# Patient Record
Sex: Female | Born: 1990 | Race: White | Hispanic: No | Marital: Married | Smoking: Never smoker
Health system: Southern US, Community
[De-identification: ages and names within clinical notes are randomized; demographics above are authoritative.]

## PROBLEM LIST (undated history)

## (undated) DIAGNOSIS — F32A Depression, unspecified: Secondary | ICD-10-CM

## (undated) DIAGNOSIS — F419 Anxiety disorder, unspecified: Secondary | ICD-10-CM

## (undated) DIAGNOSIS — F329 Major depressive disorder, single episode, unspecified: Secondary | ICD-10-CM

## (undated) DIAGNOSIS — F909 Attention-deficit hyperactivity disorder, unspecified type: Secondary | ICD-10-CM

## (undated) HISTORY — DX: Major depressive disorder, single episode, unspecified: F32.9

## (undated) HISTORY — PX: WISDOM TOOTH EXTRACTION: SHX21

## (undated) HISTORY — PX: BREAST ENHANCEMENT SURGERY: SHX7

## (undated) HISTORY — DX: Depression, unspecified: F32.A

## (undated) HISTORY — DX: Anxiety disorder, unspecified: F41.9

## (undated) HISTORY — DX: Attention-deficit hyperactivity disorder, unspecified type: F90.9

---
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
## 2012-09-22 ENCOUNTER — Observation Stay: Payer: Self-pay | Admitting: Obstetrics and Gynecology

## 2012-09-24 ENCOUNTER — Observation Stay: Payer: Self-pay

## 2012-10-07 ENCOUNTER — Observation Stay: Payer: Self-pay | Admitting: Obstetrics and Gynecology

## 2012-10-07 LAB — URINALYSIS, COMPLETE
Leukocyte Esterase: NEGATIVE
Ph: 6 (ref 4.5–8.0)
Protein: NEGATIVE
RBC,UR: NONE SEEN /HPF (ref 0–5)
Specific Gravity: 1.014 (ref 1.003–1.030)
Squamous Epithelial: 3

## 2012-10-09 ENCOUNTER — Inpatient Hospital Stay: Payer: Self-pay | Admitting: Obstetrics and Gynecology

## 2012-10-09 LAB — CBC WITH DIFFERENTIAL/PLATELET
Eosinophil %: 0.1 %
HCT: 34.3 % — ABNORMAL LOW (ref 35.0–47.0)
Lymphocyte #: 1.8 10*3/uL (ref 1.0–3.6)
Lymphocyte %: 17.7 %
MCV: 85 fL (ref 80–100)
Monocyte #: 0.7 x10 3/mm (ref 0.2–0.9)
Monocyte %: 7 %
Neutrophil %: 74.8 %
Platelet: 238 10*3/uL (ref 150–440)
RBC: 4.01 10*6/uL (ref 3.80–5.20)

## 2012-10-10 LAB — HEMATOCRIT: HCT: 30.6 % — ABNORMAL LOW (ref 35.0–47.0)

## 2015-01-16 NOTE — H&P (Signed)
L&D Evaluation:  History:   HPI 24 y/o G1 @ 38/3wks EDC 10/19/12 arrived through the night with c/o painful contractions. Denies leaking fuid or vaginal bleeding, baby is active. Care @ [facility] well pregnancy, GBS negative.    Presents with contractions    Patient's Medical History No Chronic Illness    Patient's Surgical History wisdom teeth    Medications Pre Natal Vitamins    Allergies NKDA    Social History none   ROS:   ROS All systems were reviewed.  HEENT, CNS, GI, GU, Respiratory, CV, Renal and Musculoskeletal systems were found to be normal.   Exam:   Vital Signs stable    Urine Protein not completed    General no apparent distress    Mental Status clear    Chest clear    Heart normal sinus rhythm    Abdomen gravid, non-tender    Estimated Fetal Weight Average for gestational age    Fetal Position vtx    Fundal Height term    Back no CVAT    Edema 1+  pedal    Reflexes 2+    Clonus negative    Pelvic no external lesions, 1-2cms upon arrival progressed to 4-5cm BBOW vtx @ -1 per RN exam  nl show    Mebranes Intact    FHT normal rate with no decels, aseline 130's avg variabilily with accels    Fetal Heart Rate 136    Ucx regular, Q 3 mins 60 sec mod/str EFM readjusted    Skin dry    Lymph no lymphadenopathy   Impression:   Impression active labor   Plan:   Plan EFM/NST, monitor contractions and for cervical change    Comments Labor progress through the night, admitted. Explained what to expect with first baby. Requests epidural, stadol given while awaiting. Husband to arrive shortly.   Electronic Signatures: Albertina ParrLugiano, Taiyo Kozma B (CNM)  (Signed 01-Feb-14 09:14)  Authored: L&D Evaluation   Last Updated: 01-Feb-14 09:14 by Albertina ParrLugiano, Annell Canty B (CNM)

## 2015-07-11 ENCOUNTER — Ambulatory Visit
Admission: RE | Admit: 2015-07-11 | Discharge: 2015-07-11 | Disposition: A | Discharge status: Home / Self Care | Payer: Managed Care, Other (non HMO) | Source: Ambulatory Visit | Attending: Obstetrics and Gynecology | Admitting: Obstetrics and Gynecology

## 2015-07-11 ENCOUNTER — Other Ambulatory Visit: Payer: Self-pay | Admitting: Obstetrics and Gynecology

## 2015-07-11 DIAGNOSIS — N63 Unspecified lump in unspecified breast: Secondary | ICD-10-CM

## 2015-07-11 DIAGNOSIS — R928 Other abnormal and inconclusive findings on diagnostic imaging of breast: Secondary | ICD-10-CM | POA: Insufficient documentation

## 2015-10-02 DIAGNOSIS — G43009 Migraine without aura, not intractable, without status migrainosus: Secondary | ICD-10-CM | POA: Insufficient documentation

## 2015-11-26 ENCOUNTER — Other Ambulatory Visit: Payer: Self-pay | Admitting: Unknown Physician Specialty

## 2015-11-26 DIAGNOSIS — M5489 Other dorsalgia: Secondary | ICD-10-CM

## 2015-11-26 DIAGNOSIS — G8929 Other chronic pain: Principal | ICD-10-CM

## 2015-11-26 DIAGNOSIS — M549 Dorsalgia, unspecified: Secondary | ICD-10-CM

## 2015-11-26 DIAGNOSIS — M542 Cervicalgia: Secondary | ICD-10-CM

## 2015-12-17 ENCOUNTER — Ambulatory Visit
Admission: RE | Admit: 2015-12-17 | Discharge: 2015-12-17 | Disposition: A | Discharge status: Home / Self Care | Payer: Managed Care, Other (non HMO) | Source: Ambulatory Visit | Attending: Unknown Physician Specialty | Admitting: Unknown Physician Specialty

## 2015-12-17 ENCOUNTER — Ambulatory Visit: Payer: Managed Care, Other (non HMO)

## 2015-12-17 DIAGNOSIS — M50222 Other cervical disc displacement at C5-C6 level: Secondary | ICD-10-CM | POA: Diagnosis not present

## 2015-12-17 DIAGNOSIS — M5489 Other dorsalgia: Secondary | ICD-10-CM

## 2015-12-17 DIAGNOSIS — M549 Dorsalgia, unspecified: Secondary | ICD-10-CM | POA: Diagnosis present

## 2015-12-17 DIAGNOSIS — G8929 Other chronic pain: Secondary | ICD-10-CM | POA: Diagnosis present

## 2015-12-17 DIAGNOSIS — M542 Cervicalgia: Secondary | ICD-10-CM

## 2015-12-17 DIAGNOSIS — M5127 Other intervertebral disc displacement, lumbosacral region: Secondary | ICD-10-CM | POA: Diagnosis not present

## 2016-05-08 ENCOUNTER — Ambulatory Visit: Payer: 59 | Admitting: Psychiatry

## 2016-05-13 ENCOUNTER — Ambulatory Visit: Payer: Self-pay | Admitting: Psychiatry

## 2016-05-20 ENCOUNTER — Ambulatory Visit: Payer: 59 | Admitting: Psychiatry

## 2016-05-20 ENCOUNTER — Encounter: Payer: Self-pay | Admitting: Psychiatry

## 2016-05-20 VITALS — BP 132/78 | HR 103 | Temp 97.5°F | Wt 149.0 lb

## 2016-05-20 DIAGNOSIS — F411 Generalized anxiety disorder: Secondary | ICD-10-CM

## 2016-05-20 MED ORDER — SERTRALINE HCL 50 MG PO TABS
50.0000 mg | ORAL_TABLET | Freq: Every day | ORAL | 1 refills | Status: DC
Start: 2016-05-20 — End: 2016-06-18
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
## 2016-06-11 ENCOUNTER — Ambulatory Visit: Payer: 59 | Admitting: Licensed Clinical Social Worker

## 2016-06-18 ENCOUNTER — Encounter: Payer: Self-pay | Admitting: Psychiatry

## 2016-06-18 ENCOUNTER — Ambulatory Visit (INDEPENDENT_AMBULATORY_CARE_PROVIDER_SITE_OTHER): Payer: 59 | Admitting: Psychiatry

## 2016-06-18 VITALS — BP 128/76 | HR 84 | Temp 98.1°F | Wt 149.4 lb

## 2016-06-18 DIAGNOSIS — F411 Generalized anxiety disorder: Secondary | ICD-10-CM

## 2016-06-18 MED ORDER — SERTRALINE HCL 100 MG PO TABS: 100.0000 mg | ORAL_TABLET | Freq: Every day | ORAL | 1 refills | Status: DC

## 2016-06-18 NOTE — Progress Notes (Signed)
Psychiatric Initial Adult Assessment   Patient Identification: Alexa Jacobs MRN:  [id number] Date of Evaluation:  06/18/2016 Referral Source: Self referred Chief Complaint:   Chief Complaint    Follow-up; Medication Refill     Visit Diagnosis:    ICD-9-CM ICD-10-CM   1. Generalized anxiety disorder 300.02 F41.1     History of Present Illness:   Patient is a 25 year old Caucasian woman presenting for a follow up and treatment of her anxiety. Patient today reports that she is been going through a lot. States that she is been sleeping okay and eats whenever she gets hungry. States that her ex husband has the negotiated for the 7 day on an 7 day off custody between the 2 of them initially and now he is trying to get full custody of her son. States that she is finding it difficult to trust anybody. She denies any suicidal thoughts. She will be starting with a therapist this week.   Past Psychiatric History: Was seeing Dr.Kapur, switching doctors due to change in insurance.  Previous Psychotropic Medications: Yes   Substance Abuse History in the last 12 months:  No.  Consequences of Substance Abuse: Negative  Past Medical History:  Past Medical History:  Diagnosis Date  . ADHD (attention deficit hyperactivity disorder)   . Anxiety   . Depression     Past Surgical History:  Procedure Laterality Date  . WISDOM TOOTH EXTRACTION      Family Psychiatric History:   Family History:  Family History  Problem Relation Age of Onset  . Depression Mother   . Anxiety disorder Mother     Social History:   Social History   Social History  . Marital status: Married    Spouse name: N/A  . Number of children: N/A  . Years of education: N/A   Social History Main Topics  . Smoking status: Never Smoker  . Smokeless tobacco: Never Used  . Alcohol use No  . Drug use: No  . Sexual activity: Not Currently   Other Topics Concern  . None   Social History Narrative  . None     Additional Social History: Lives with her 24 and half year old son  Allergies:  No Known Allergies  Metabolic Disorder Labs: No results found for: HGBA1C, MPG No results found for: PROLACTIN Lab Results  Component Value Date   CHOL 153 10/24/2010   TRIG 79.0 10/24/2010   HDL 53.60 10/24/2010   CHOLHDL 3 10/24/2010   VLDL 15.8 10/24/2010   LDLCALC 84 10/24/2010     Current Medications: Current Outpatient Prescriptions  Medication Sig Dispense Refill  . fluticasone (FLONASE) 50 MCG/ACT nasal spray Place into the nose.    . levonorgestrel (MIRENA) 20 MCG/24HR IUD by Intrauterine route.    . sertraline (ZOLOFT) 50 MG tablet Take 1 tablet (50 mg total) by mouth daily. 30 tablet 1  . nortriptyline (PAMELOR) 10 MG capsule Take by mouth.    . phentermine (ADIPEX-P) 37.5 MG tablet Take by mouth.     No current facility-administered medications for this visit.     Neurologic: Headache: Yes Seizure: No Paresthesias:No  Musculoskeletal: Strength & Muscle Tone: within normal limits Gait & Station: normal Patient leans: N/A  Psychiatric Specialty Exam: ROS  Blood pressure 128/76, pulse 84, temperature 98.1 F (36.7 C), temperature source Oral, weight 149 lb 6.4 oz (67.8 kg), last menstrual period 06/16/2016.Body mass index is 24.86 kg/m.  General Appearance: Fairly Groomed  Eye Contact:  Fair  Speech:  Clear and Coherent  Volume:  Normal  Mood:  Anxious and Dysphoric  Affect:  Congruent  Thought Process:  Coherent  Orientation:  Full (Time, Place, and Person)  Thought Content:  WDL  Suicidal Thoughts:  No  Homicidal Thoughts:  No  Memory:  Immediate;   Fair Recent;   Fair Remote;   Fair  Judgement:  Fair  Insight:  Fair  Psychomotor Activity:  Normal  Concentration:  Concentration: Fair and Attention Span: Fair  Recall:  [employer] of Knowledge:Fair  Language: Fair  Akathisia:  No  Handed:  Right  AIMS (if indicated):  na  Assets:  Communication  Skills Desire for Improvement Housing Physical Health Resilience Social Support  ADL's:  Intact  Cognition: WNL  Sleep:  fair    Treatment Plan Summary:  Anxiety  Increase  Zoloft to 100mg  po qd. Recommend that patient start to see a therapist to deal with her current stressors.  Migraines She has discontinued the Nortriptyline.  Patient has discontinued phentermine  Return to Clinic in 1 month's time or call before if necessary.   Patrick North, MD 10/11/201710:02 AM

## 2016-06-19 ENCOUNTER — Ambulatory Visit (INDEPENDENT_AMBULATORY_CARE_PROVIDER_SITE_OTHER): Payer: 59 | Admitting: Licensed Clinical Social Worker

## 2016-06-19 DIAGNOSIS — F411 Generalized anxiety disorder: Secondary | ICD-10-CM
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
## 2016-07-16 ENCOUNTER — Ambulatory Visit: Payer: 59 | Admitting: Psychiatry

## 2016-07-17 ENCOUNTER — Ambulatory Visit (INDEPENDENT_AMBULATORY_CARE_PROVIDER_SITE_OTHER): Payer: 59 | Admitting: Licensed Clinical Social Worker

## 2016-07-17 DIAGNOSIS — F411 Generalized anxiety disorder: Secondary | ICD-10-CM

## 2016-07-18 NOTE — Progress Notes (Signed)
   THERAPIST PROGRESS NOTE  Session Time: 30  Participation Level: Active  Behavioral Response: Casual and NeatAlertAnxious  Type of Therapy: Individual Therapy  Treatment Goals addressed: Coping  Interventions: Strength-based and Supportive  Summary: Jobe MarkerCaroline H Murray is a 25 y.o. female who presents with continued symptoms of her diagnosis.  LCSW discussed what psychotherapy is and is not and the importance of the therapeutic relationship to include open and honest communication between client and therapist and building trust.  Reviewed advantages and disadvantages of the therapeutic process and limitations to the therapeutic relationship including LCSW's role in maintaining the safety of the client, others and those in client's care.    Suicidal/Homicidal: Nowithout intent/plan  Therapist Response: Patient to complete list of daily activities and be able to complete at least half of the list.  Patient to practice coping skills daily (deep breathing)  Plan: Return again in 2 weeks.  Diagnosis: Axis I: Generalized Anxiety Disorder    Axis II: No diagnosis    Marinda Elkicole M Peacock, LCSW 07/18/2016

## 2016-12-27 ENCOUNTER — Other Ambulatory Visit: Payer: Self-pay | Admitting: Psychiatry

## 2017-05-05 ENCOUNTER — Telehealth: Payer: Self-pay

## 2017-05-05 NOTE — Telephone Encounter (Signed)
pt called states she needs to have a letter stating that she was not treated for depress and that the medication was not for depress. pt would like for doctor to call so she can explain more.   Pt states she received a letter from her life insurance policy that her payment was going up do to her medication and that it a antidepressant and that is why her payment are going up. Pt states that her neurologist put her on medicaiton but it wasn't for depress.   So she needs a letter explaining her treatment

## 2017-05-06 NOTE — Telephone Encounter (Signed)
I saw patient once in October of last year with no follow-up. So I cannot provide any type of letter for for this patient.

## 2017-05-06 NOTE — Telephone Encounter (Signed)
Left message that a letter would not be done because she was only seen once per dr. Daleen Boavi order. Pt was advised that if she needed the office notes she would need to come by office to sign a release

## 2018-11-10 DIAGNOSIS — E538 Deficiency of other specified B group vitamins: Secondary | ICD-10-CM | POA: Diagnosis not present

## 2018-11-10 DIAGNOSIS — R0789 Other chest pain: Secondary | ICD-10-CM | POA: Diagnosis not present

## 2018-12-01 DIAGNOSIS — G43009 Migraine without aura, not intractable, without status migrainosus: Secondary | ICD-10-CM | POA: Diagnosis not present

## 2018-12-01 DIAGNOSIS — R072 Precordial pain: Secondary | ICD-10-CM | POA: Diagnosis not present

## 2018-12-01 DIAGNOSIS — R0789 Other chest pain: Secondary | ICD-10-CM | POA: Diagnosis not present

## 2018-12-02 DIAGNOSIS — R072 Precordial pain: Secondary | ICD-10-CM | POA: Diagnosis not present

## 2018-12-07 DIAGNOSIS — M62830 Muscle spasm of back: Secondary | ICD-10-CM | POA: Diagnosis not present

## 2018-12-07 DIAGNOSIS — M9901 Segmental and somatic dysfunction of cervical region: Secondary | ICD-10-CM | POA: Diagnosis not present

## 2018-12-07 DIAGNOSIS — M9902 Segmental and somatic dysfunction of thoracic region: Secondary | ICD-10-CM | POA: Diagnosis not present

## 2018-12-15 DIAGNOSIS — M62830 Muscle spasm of back: Secondary | ICD-10-CM | POA: Diagnosis not present

## 2018-12-15 DIAGNOSIS — M9902 Segmental and somatic dysfunction of thoracic region: Secondary | ICD-10-CM | POA: Diagnosis not present

## 2018-12-15 DIAGNOSIS — M9901 Segmental and somatic dysfunction of cervical region: Secondary | ICD-10-CM | POA: Diagnosis not present

## 2018-12-20 DIAGNOSIS — M62830 Muscle spasm of back: Secondary | ICD-10-CM | POA: Diagnosis not present

## 2018-12-20 DIAGNOSIS — M9901 Segmental and somatic dysfunction of cervical region: Secondary | ICD-10-CM | POA: Diagnosis not present

## 2018-12-20 DIAGNOSIS — M9902 Segmental and somatic dysfunction of thoracic region: Secondary | ICD-10-CM | POA: Diagnosis not present

## 2019-08-18 ENCOUNTER — Other Ambulatory Visit: Payer: Self-pay | Admitting: Internal Medicine

## 2019-08-18 ENCOUNTER — Ambulatory Visit
Admission: RE | Admit: 2019-08-18 | Discharge: 2019-08-18 | Disposition: A | Discharge status: Home / Self Care | Payer: 59 | Source: Ambulatory Visit | Attending: Internal Medicine | Admitting: Internal Medicine

## 2019-08-18 ENCOUNTER — Other Ambulatory Visit: Payer: Self-pay

## 2019-08-18 DIAGNOSIS — R945 Abnormal results of liver function studies: Secondary | ICD-10-CM | POA: Insufficient documentation

## 2019-08-18 DIAGNOSIS — R7989 Other specified abnormal findings of blood chemistry: Secondary | ICD-10-CM

## 2019-08-18 DIAGNOSIS — R61 Generalized hyperhidrosis: Secondary | ICD-10-CM | POA: Diagnosis present

## 2019-09-20 ENCOUNTER — Encounter: Payer: Self-pay | Admitting: Plastic Surgery

## 2019-09-20 ENCOUNTER — Ambulatory Visit (INDEPENDENT_AMBULATORY_CARE_PROVIDER_SITE_OTHER): Payer: Self-pay | Admitting: Plastic Surgery

## 2019-09-20 ENCOUNTER — Other Ambulatory Visit: Payer: Self-pay

## 2019-09-20 DIAGNOSIS — Z719 Counseling, unspecified: Secondary | ICD-10-CM | POA: Insufficient documentation

## 2019-09-20 NOTE — Progress Notes (Signed)
Patient ID: Alexa Jacobs, female    DOB: 09-28-1990, 28 y.o.   MRN: [id number]   Chief Complaint  Patient presents with  . Advice Only    for 2 opinion breast infection     The patient is a 29 year old female here for evaluation of her breasts. Mentation in early December. She is 5 feet 5 inches tall and weighs 145 pounds. She is married and has a child. The patient states she noticed she had severe loss of breast volume after her pregnancy. Basically an A cup and went to [facility] for a breast augmentation. She had Mentor high-profile silicone implants placed 500 cc bilaterally as far as she can remember. She has the forms at home and said she could call us with the. On the right breast she notified her physician from [facility] even antibiotics. That did help with the redness. Right side is still very painful, more swollen than the left and she is having trouble raising her arm. On exam the redness seems to have improved dramatically. She has an opening in her skin on the lateral aspect within the right inframammary incision. It appears to be the implant that I can visualize. I am concerned that the remaining portion of the incision is going to open. At this point I do believe for implant is exposed. She was on Cipro and then Bactrim. The left breast incision appears to be healing well. It is tender and a little bit firm as expected.   Review of Systems  Constitutional: Positive for activity change. Negative for appetite change.  HENT: Negative.   Eyes: Negative.   Respiratory: Negative for chest tightness.   Cardiovascular: Negative for leg swelling.  Gastrointestinal: Negative for abdominal distention.  Endocrine: Negative.   Genitourinary: Negative.   Musculoskeletal: Positive for myalgias.       Right shoulder / breast tenderness with decrease ROM  Hematological: Negative.   Psychiatric/Behavioral: Negative.     Past Medical History:  Diagnosis Date  . ADHD (attention deficit  hyperactivity disorder)   . Anxiety   . Depression     Past Surgical History:  Procedure Laterality Date  . WISDOM TOOTH EXTRACTION        Current Outpatient Medications:  .  ciprofloxacin (CIPRO) 750 MG tablet, Take 750 mg by mouth 2 (two) times daily., Disp: , Rfl:  .  levonorgestrel (MIRENA) 20 MCG/24HR IUD, by Intrauterine route., Disp: , Rfl:  .  sulfamethoxazole-trimethoprim (BACTRIM DS) 800-160 MG tablet, Take 1 tablet by mouth 2 (two) times daily., Disp: , Rfl:  .  valACYclovir (VALTREX) 1000 MG tablet, Take 1 tablet by mouth as needed., Disp: , Rfl:    Objective:   Vitals:   09/20/19 0813  BP: 117/77  Pulse: 81  Temp: 97.8 F (36.6 C)  SpO2: 100%    Physical Exam Vitals and nursing note reviewed.  Constitutional:      Appearance: Normal appearance.  HENT:     Head: Normocephalic and atraumatic.  Eyes:     Extraocular Movements: Extraocular movements intact.  Cardiovascular:     Rate and Rhythm: Normal rate.     Pulses: Normal pulses.  Pulmonary:     Effort: Pulmonary effort is normal. No respiratory distress.  Chest:    Abdominal:     General: Abdomen is flat.  Skin:    General: Skin is warm.     Capillary Refill: Capillary refill takes less than 2 seconds.  Neurological:     General:  No focal deficit present.     Mental Status: She is alert and oriented to person, place, and time.  Psychiatric:        Mood and Affect: Mood normal.        Behavior: Behavior normal.        Thought Content: Thought content normal.     Assessment & Plan:  Encounter for counseling  I am concerned about the possible exposure of the right breast plant. My recommendation is first to discuss with operating surgeon. My recommendation based on what I am seeing right now: implant removal and washout with possible placement of the same implant. If there is any concern then removal and antibiotic treatment with replacement in 6 weeks. It also may be wise to go a little bit  smaller than her implant size. She has asked for a quote as well.  We remain available as needed.  The 21st Century Cures Act was signed into law in 2016 which includes the topic of electronic health records.  This provides immediate access to information in MyChart.  This includes consultation notes, operative notes, office notes, lab results and pathology reports.  If you have any questions about what you read please let us know at your next visit or call us at the office.  We are right here with you.    Alena Bills Kyaira Trantham, DO

## 2019-09-22 ENCOUNTER — Other Ambulatory Visit: Payer: Self-pay

## 2019-09-22 ENCOUNTER — Encounter: Payer: Self-pay | Admitting: Plastic Surgery

## 2019-09-22 ENCOUNTER — Ambulatory Visit: Payer: 59 | Admitting: Plastic Surgery

## 2019-09-22 VITALS — BP 123/76 | HR 93 | Temp 98.2°F | Ht 65.0 in | Wt 142.8 lb

## 2019-09-22 DIAGNOSIS — S21001A Unspecified open wound of right breast, initial encounter: Secondary | ICD-10-CM

## 2019-09-22 NOTE — Progress Notes (Signed)
Referring Provider Idelle Crouch, MD [place] [place],  [place] [address]   CC:  Chief Complaint  Patient presents with  . Advice Only    for (R) breast wound      Alexa Jacobs is an 29 y.o. female.  HPI: Patient presents to discuss a complication from her recent breast augmentation.  She had a bilateral breast augmentation done in early December in [place].  She believes they used 671 cc silicone Mentor high-profile implants.  She felt like the right side was always slightly bigger than the left side.  She is always had a bit more pain on the right side since her surgery was performed.  She also describes some bruising or pain in the lateral aspect on the right side that has since mostly resolved.  This past weekend she noticed quite a bit of drainage from the right side and describes it as runny but not exactly purulent.  She says she may have had a mild fever once or twice but she is not sure.  There was some redness around the incision and since the drainage happened the redness has gone down.  She is already seen my partner Dr. Marla Roe and at least one other surgeon here in [place].  She is also been in touch with her surgeon in [place] is planning to see him on Monday.  She has gotten several opinions about how to manage the situation but is ultimately unsure of what she wants at this point.  No Known Allergies  Outpatient Encounter Medications as of 09/22/2019  Medication Sig Note  . ciprofloxacin (CIPRO) 750 MG tablet Take 750 mg by mouth 2 (two) times daily.   Marland Kitchen levonorgestrel (MIRENA) 20 MCG/24HR IUD by Intrauterine route. 05/20/2016: Received from: [place]: Insert 1 each into the uterus once. Follow package directions.  Marland Kitchen sulfamethoxazole-trimethoprim (BACTRIM DS) 800-160 MG tablet Take 1 tablet by mouth 2 (two) times daily.   . valACYclovir (VALTREX) 1000 MG tablet Take 1 tablet by mouth as needed.     No facility-administered encounter medications on file as of 09/22/2019.     Past Medical History:  Diagnosis Date  . ADHD (attention deficit hyperactivity disorder)   . Anxiety   . Depression     Past Surgical History:  Procedure Laterality Date  . WISDOM TOOTH EXTRACTION      Family History  Problem Relation Age of Onset  . Depression Mother   . Anxiety disorder Mother     Social History   Social History Narrative  . Not on file     Review of Systems General: Denies fevers, chills, weight loss CV: Denies chest pain, shortness of breath, palpitations  Physical Exam Vitals with BMI 09/22/2019 09/20/2019 11/08/2010  Height 5\' 5"  5\' 5"  5\' 5"   Weight 142 lbs 13 oz 145 lbs 142 lbs 8 oz  BMI 23.76 24.58 09.9  Systolic 833 825 94  Diastolic 76 77 62  Pulse 93 81 88  Some encounter information is confidential and restricted. Go to Review Flowsheets activity to see all data.    General:  No acute distress,  Alert and oriented, Non-Toxic, Normal speech and affect Breast: Evidence of bilateral breast augmentation.  On the right side there is a opening in the lateral aspect of the inframammary incision with some fibrinous debris.  I did not probe it.  There is mild erythema around the incision but not too bad.  There  is no drainage on my exam.  The left side looks fine with a well-healed incision and no areas of firmness or sign of any problem.  Assessment/Plan Patient presents with wound dehiscence and drainage after breast augmentation on the right side.  She may have had a hematoma or seroma that eventually found its way out the incision.  There is not a lot of redness or purulent drainage but I think the implant is effectively exposed.  She is on antibiotics already.  She wants to keep an implant on that side I recommended removal of the implant that skin washout of the pocket and placement of a new implant of a similar or slightly smaller size.  If she allows more time to pass  with the incision open like that it could decrease her chances of salvaging the implant.  She is also entertain the option of just removing the implant on the right side or potentially just removing both implants.  I went through all these options with her and ultimately will help her as best I can when she decides what she wants to do.  She is going to call our office and let us know if she makes a decision.  [name] 09/22/2019, 4:06 PM

## 2020-06-28 IMAGING — US US ABDOMEN LIMITED
1 series · 14 of 25 positions shown · non-contrast
Comparison: None.

CLINICAL DATA: Abnormal LFTs with night sweats 1 week.

EXAM:
ULTRASOUND ABDOMEN LIMITED RIGHT UPPER QUADRANT

[Series 1: us abdomen limited · 0.20mm/px · 14 of 59 slices shown]
[im 1/59]
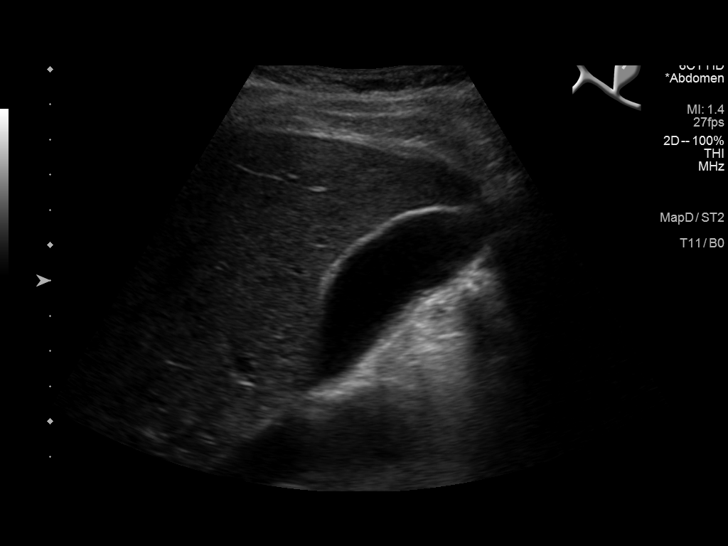
[im 5/59]
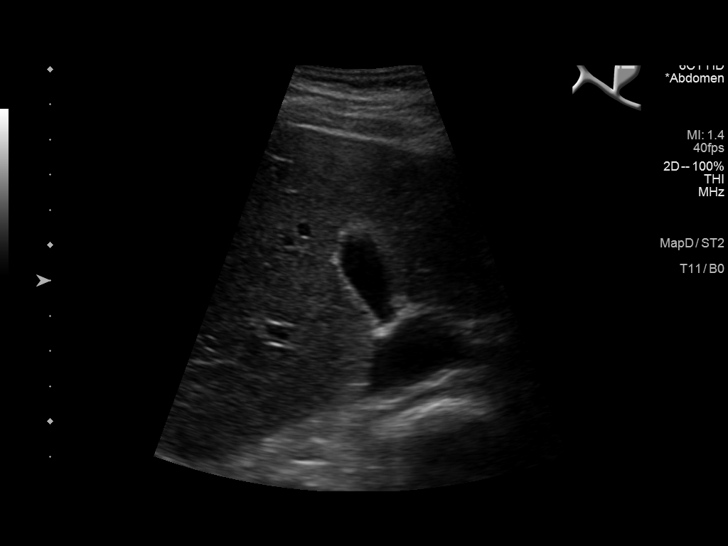
[im 10/59]
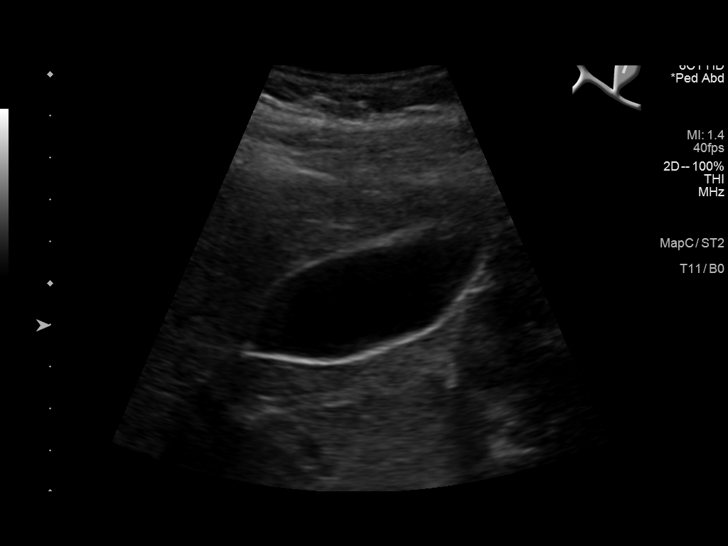
[im 15/59]
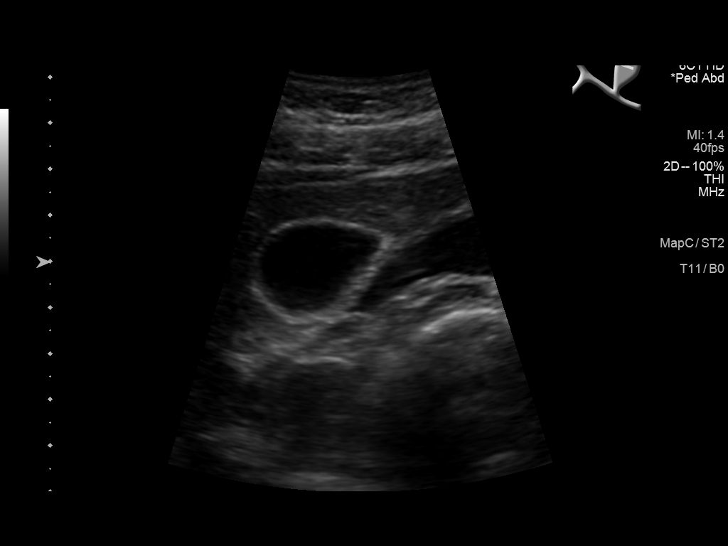
[im 20/59]
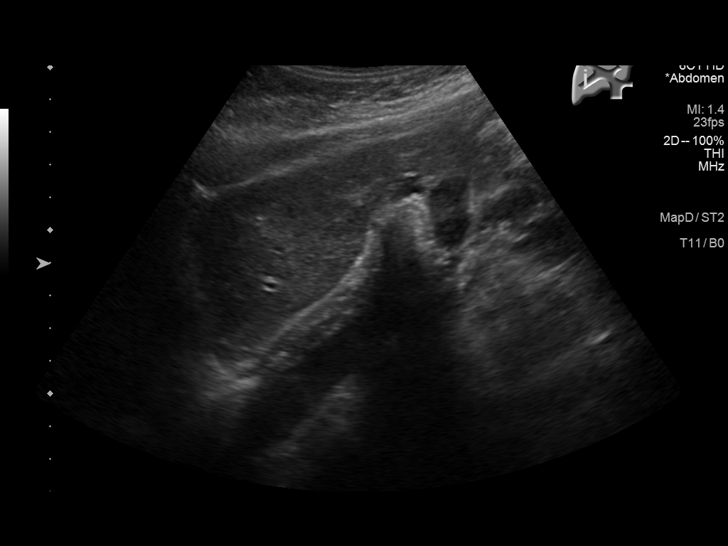
[im 22/59]
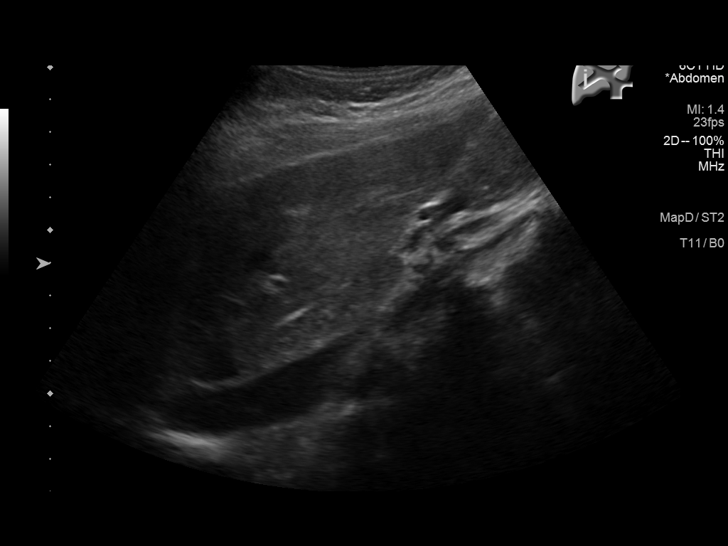
[im 27/59]
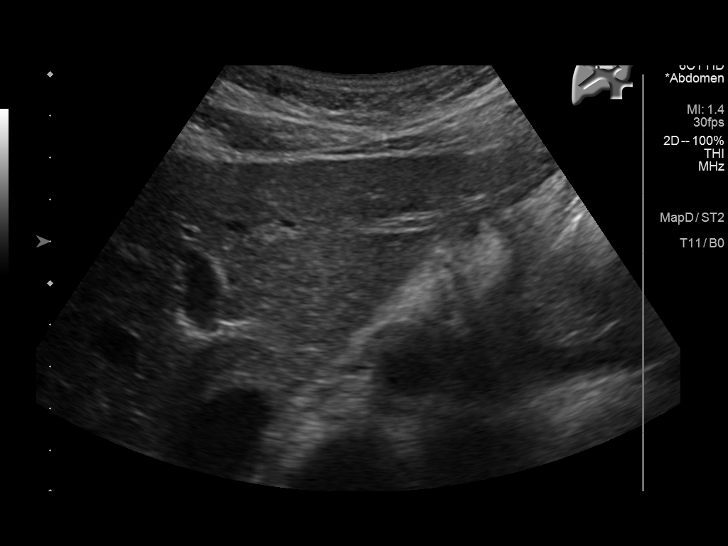
[im 32/59]
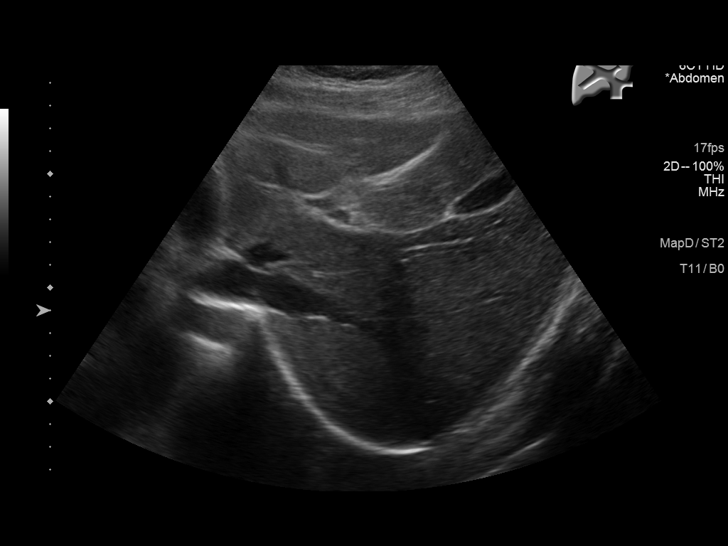
[im 37/59]
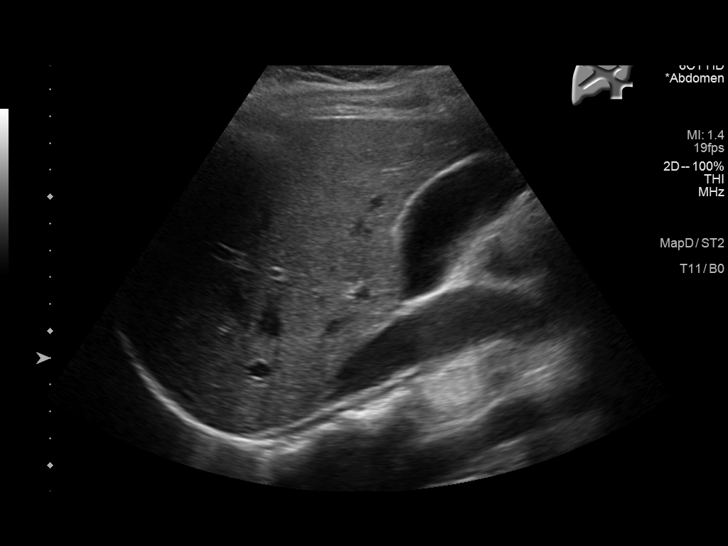
[im 39/59]
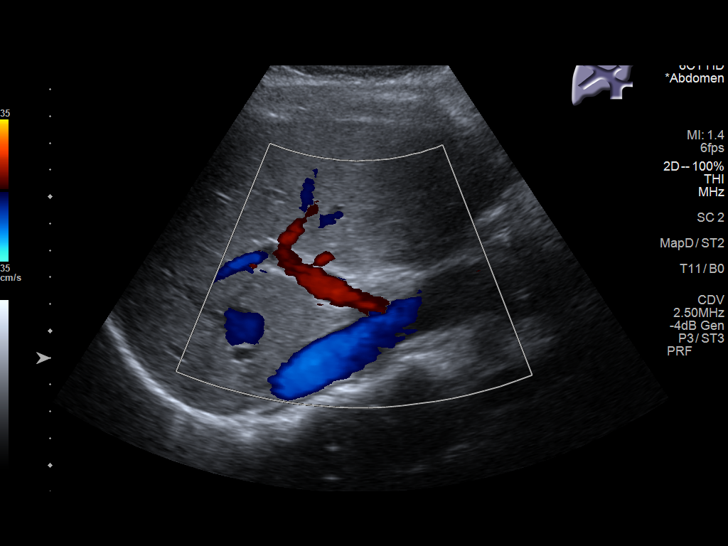
[im 44/59]
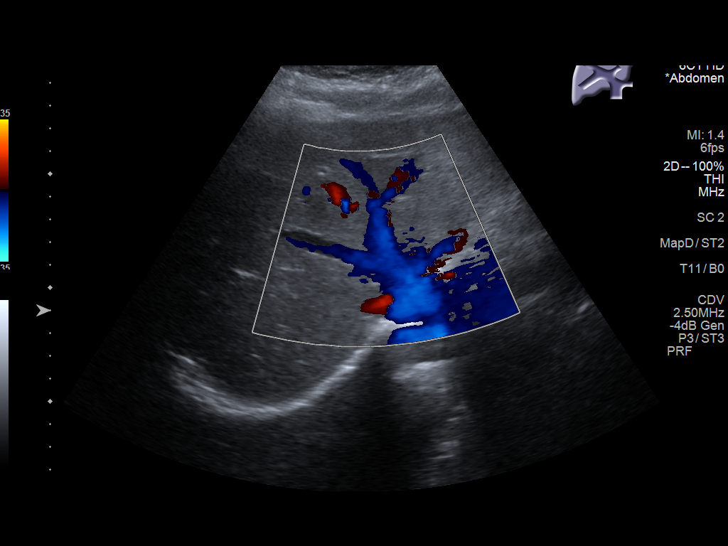
[im 49/59]
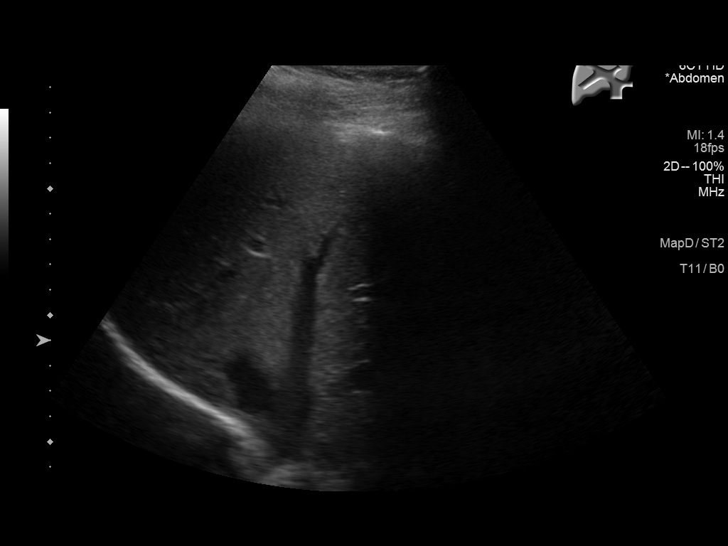
[im 54/59]
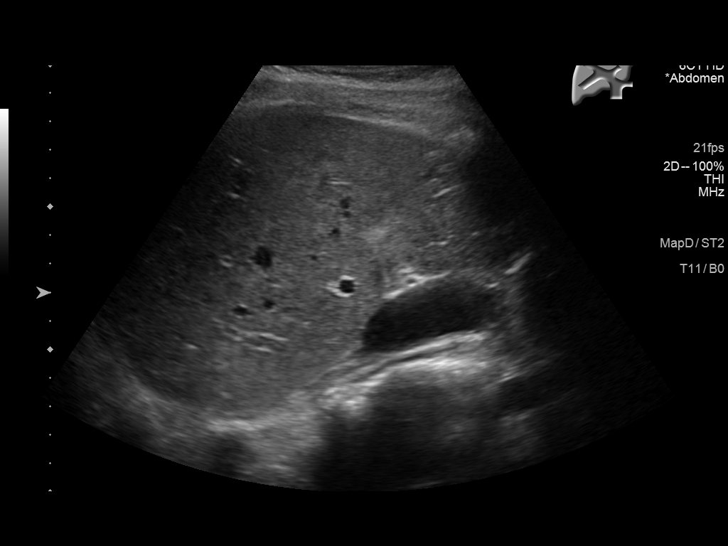
[im 59/59]
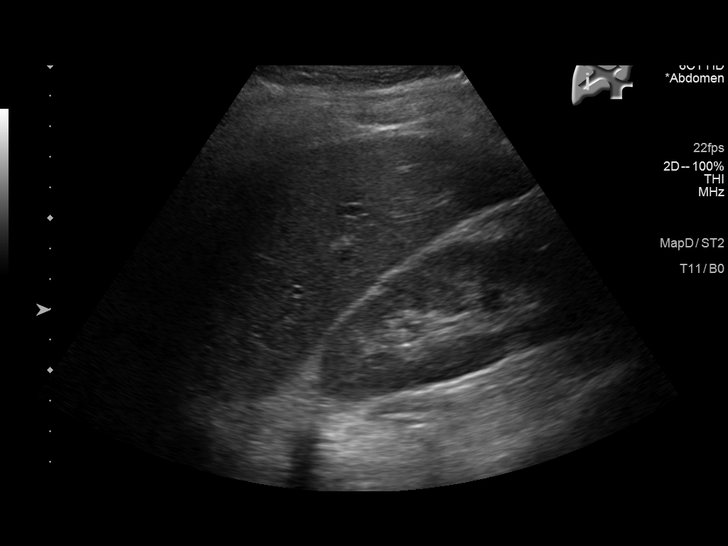

[14 of 25 positions shown; findings below may reference images not displayed]

FINDINGS: Gallbladder:

No gallstones or wall thickening visualized. No sonographic Murphy
sign noted by sonographer.

Common bile duct:

Diameter: 2.2 mm.

Liver:

No focal lesion identified. Within normal limits in parenchymal
echogenicity. Portal vein is patent on color Doppler imaging with
normal direction of blood flow towards the liver.

Other: None.
IMPRESSION: Normal right upper quadrant ultrasound.

## 2022-05-28 ENCOUNTER — Encounter: Payer: Self-pay | Admitting: Emergency Medicine

## 2022-05-28 ENCOUNTER — Emergency Department: Payer: BC Managed Care – PPO

## 2022-05-28 ENCOUNTER — Emergency Department
Admission: EM | Admit: 2022-05-28 | Discharge: 2022-05-28 | Disposition: A | Discharge status: Home / Self Care | Payer: BC Managed Care – PPO | Attending: Emergency Medicine | Admitting: Emergency Medicine

## 2022-05-28 DIAGNOSIS — O26891 Other specified pregnancy related conditions, first trimester: Secondary | ICD-10-CM | POA: Insufficient documentation

## 2022-05-28 DIAGNOSIS — Z3A09 9 weeks gestation of pregnancy: Secondary | ICD-10-CM

## 2022-05-28 DIAGNOSIS — R109 Unspecified abdominal pain: Secondary | ICD-10-CM

## 2022-05-28 LAB — URINALYSIS, ROUTINE W REFLEX MICROSCOPIC
Bilirubin Urine: NEGATIVE
Glucose, UA: NEGATIVE mg/dL
Hgb urine dipstick: NEGATIVE
Ketones, ur: 15 mg/dL — AB
Nitrite: NEGATIVE
Protein, ur: NEGATIVE mg/dL
Specific Gravity, Urine: 1.025 (ref 1.005–1.030)
pH: 5.5 (ref 5.0–8.0)

## 2022-05-28 LAB — CBC WITH DIFFERENTIAL/PLATELET
Abs Immature Granulocytes: 0.03 10*3/uL (ref 0.00–0.07)
Basophils Absolute: 0 10*3/uL (ref 0.0–0.1)
Basophils Relative: 0 %
Eosinophils Absolute: 0 10*3/uL (ref 0.0–0.5)
Eosinophils Relative: 0 %
HCT: 33.3 % — ABNORMAL LOW (ref 36.0–46.0)
Hemoglobin: 11.2 g/dL — ABNORMAL LOW (ref 12.0–15.0)
Immature Granulocytes: 0 %
Lymphocytes Relative: 18 %
Lymphs Abs: 1.3 10*3/uL (ref 0.7–4.0)
MCH: 29.3 pg (ref 26.0–34.0)
MCHC: 33.6 g/dL (ref 30.0–36.0)
MCV: 87.2 fL (ref 80.0–100.0)
Monocytes Absolute: 0.4 10*3/uL (ref 0.1–1.0)
Monocytes Relative: 6 %
Neutro Abs: 5.5 10*3/uL (ref 1.7–7.7)
Neutrophils Relative %: 76 %
Platelets: 238 10*3/uL (ref 150–400)
RBC: 3.82 MIL/uL — ABNORMAL LOW (ref 3.87–5.11)
RDW: 12.4 % (ref 11.5–15.5)
WBC: 7.3 10*3/uL (ref 4.0–10.5)
nRBC: 0 % (ref 0.0–0.2)

## 2022-05-28 LAB — COMPREHENSIVE METABOLIC PANEL
ALT: 19 U/L (ref 0–44)
AST: 17 U/L (ref 15–41)
Albumin: 4.3 g/dL (ref 3.5–5.0)
Alkaline Phosphatase: 71 U/L (ref 38–126)
Anion gap: 9 (ref 5–15)
BUN: 9 mg/dL (ref 6–20)
CO2: 21 mmol/L — ABNORMAL LOW (ref 22–32)
Calcium: 9 mg/dL (ref 8.9–10.3)
Chloride: 105 mmol/L (ref 98–111)
Creatinine, Ser: 0.51 mg/dL (ref 0.44–1.00)
GFR, Estimated: >60 mL/min (ref >60)
Glucose, Bld: 99 mg/dL (ref 70–99)
Potassium: 3.9 mmol/L (ref 3.5–5.1)
Sodium: 135 mmol/L (ref 135–145)
Total Bilirubin: 0.7 mg/dL (ref 0.3–1.2)
Total Protein: 7.1 g/dL (ref 6.5–8.1)

## 2022-05-28 LAB — HCG, QUANTITATIVE, PREGNANCY: hCG, Beta Chain, Quant, S: 142983 m[IU]/mL — ABNORMAL HIGH (ref <5)

## 2022-05-28 LAB — URINALYSIS, MICROSCOPIC (REFLEX)

## 2022-05-28 LAB — POC URINE PREG, ED: Preg Test, Ur: POSITIVE — AB

## 2022-05-28 MED ORDER — ONDANSETRON 8 MG PO TBDP: 8.0000 mg | ORAL_TABLET | Freq: Three times a day (TID) | ORAL | 0 refills | Status: AC | PRN

## 2022-05-28 NOTE — ED Triage Notes (Signed)
Patient ambulatory to triage with steady gait, without difficulty or distress noted; pt reports she took a home pregnancy test today and it was positive, having abd pain; as an IUD in place, LMP 4/25; G3P2

## 2022-05-28 NOTE — ED Provider Notes (Signed)
[facility] Provider Note   Event Date/Time   First MD Initiated Contact with Patient 05/28/22 2132     (approximate) History  Abdominal Pain  HPI Alexa Jacobs is a 31 y.o. female with no stated past medical history who presents for cramping abdominal pain over the last few weeks and after a positive pregnancy test at home.  Patient states that she believes she had an IUD in place however she has never felt the strings.  Patient is concerned as she has not had a period since April that this may be an ectopic pregnancy. ROS: Patient currently denies any vision changes, tinnitus, difficulty speaking, facial droop, sore throat, chest pain, shortness of breath, nausea/vomiting/diarrhea, dysuria, or weakness/numbness/paresthesias in any extremity   Physical Exam  Triage Vital Signs: ED Triage Vitals  Enc Vitals Group     BP 05/28/22 2038 (!) 143/81     Pulse Rate 05/28/22 2038 82     Resp 05/28/22 2038 16     Temp 05/28/22 2038 98.5 F (36.9 C)     Temp Source 05/28/22 2038 Oral     SpO2 05/28/22 2038 97 %     Weight 05/28/22 2036 195 lb (88.5 kg)     Height 05/28/22 2036 5\' 5"  (1.651 m)     Head Circumference --      Peak Flow --      Pain Score 05/28/22 2036 6     Pain Loc --      Pain Edu? --      Excl. in [place]? --    Most recent vital signs: Vitals:   05/28/22 2038  BP: (!) 143/81  Pulse: 82  Resp: 16  Temp: 98.5 F (36.9 C)  SpO2: 97%   General: Awake, oriented x4. CV:  Good peripheral perfusion.  Resp:  Normal effort.  Abd:  No distention.  Other:  Overweight middle-aged Caucasian female sitting in bed in no acute distress ED Results / Procedures / Treatments  Labs (all labs ordered are listed, but only abnormal results are displayed) Labs Reviewed  HCG, QUANTITATIVE, PREGNANCY - Abnormal; Notable for the following components:      Result Value   hCG, Beta Chain, Quant, S [id number] (*)    All other components within normal limits   URINALYSIS, ROUTINE W REFLEX MICROSCOPIC - Abnormal; Notable for the following components:   Ketones, ur 15 (*)    Leukocytes,Ua TRACE (*)    All other components within normal limits  CBC WITH DIFFERENTIAL/PLATELET - Abnormal; Notable for the following components:   RBC 3.82 (*)    Hemoglobin 11.2 (*)    HCT 33.3 (*)    All other components within normal limits  COMPREHENSIVE METABOLIC PANEL - Abnormal; Notable for the following components:   CO2 21 (*)    All other components within normal limits  URINALYSIS, MICROSCOPIC (REFLEX) - Abnormal; Notable for the following components:   Bacteria, UA RARE (*)    All other components within normal limits  POC URINE PREG, ED - Abnormal; Notable for the following components:   Preg Test, Ur POSITIVE (*)    All other components within normal limits  ABO/RH   RADIOLOGY ED MD interpretation: OB ultrasound interpreted by me and shows a single live intrauterine pregnancy with estimated gestational age of approximately 9 weeks.  There are no adnexal mass or evidence of acute ectopic pregnancy.  There is no IUD seen in the uterus -Agree with radiology assessment Official radiology  report(s): US OB Comp Less 14 Wks  Result Date: 05/28/2022 CLINICAL DATA:  Pregnant patient with pain, unknown LMP. History of IUD. EXAM: OBSTETRIC <14 WK ULTRASOUND TECHNIQUE: Transabdominal ultrasound was performed for evaluation of the gestation as well as the maternal uterus and adnexal regions. COMPARISON:  None Available. FINDINGS: Intrauterine gestational sac: Single Yolk sac:  Visualized. Embryo:  Visualized. Cardiac Activity: Visualized. Heart Rate: 167 bpm CRL:   23 mm   9 w 0 d                  [place] EDC: 12/31/2022 Subchorionic hemorrhage:  None visualized. Maternal uterus/adnexae: Anteverted uterus. Single live intrauterine pregnancy. No IUD is seen on the current exam. Both ovaries are visualized and are normal. No adnexal mass. No pelvic free fluid. IMPRESSION: 1.  Single live intrauterine pregnancy estimated gestational age [redacted] weeks 0 days based on crown-rump length for ultrasound [facility] 12/31/2022. 2. No adnexal mass or evidence of acute optic pregnancy. 3. IUD is not seen in the uterus. Electronically Signed   By: Narda Rutherford M.D.   On: 05/28/2022 21:41   PROCEDURES: Critical Care performed: No Procedures MEDICATIONS ORDERED IN ED: Medications - No data to display IMPRESSION / MDM / ASSESSMENT AND PLAN / ED COURSE  I reviewed the triage vital signs and the nursing notes.                             The patient is on the cardiac monitor to evaluate for evidence of arrhythmia and/or significant heart rate changes. Patient's presentation is most consistent with acute presentation with potential threat to life or bodily function. Patient is a 31 year old female with the above-stated past medical history who presents for cramping abdominal pain over the last few weeks and a positive pregnancy test at home today.  Patient has a positive pregnancy test in our emergency department as well as an ultrasound showing approximately [redacted] weeks gestation single viable intrauterine pregnancy. Patients symptoms not typical for emergent causes of abdominal pain such as, but not limited to, appendicitis, abdominal aortic aneurysm, surgical biliary disease, pancreatitis, SBO, mesenteric ischemia, serious intra-abdominal bacterial illness. Presentation also not typical of gynecologic emergencies such as TOA, Ovarian Torsion, PID. Not Ectopic. Doubt atypical ACS.  Pt tolerating PO. Disposition: Patient will be discharged with strict return precautions and follow up with primary MD within 12-24 hours for further evaluation. Patient understands that this still may have an early presentation of an emergent medical condition such as appendicitis that will require a recheck.   FINAL CLINICAL IMPRESSION(S) / ED DIAGNOSES   Final diagnoses:  [redacted] weeks gestation of pregnancy   Abdominal cramping   Rx / DC Orders   ED Discharge Orders          Ordered    ondansetron (ZOFRAN-ODT) 8 MG disintegrating tablet  Every 8 hours PRN        05/28/22 2205           Note:  This document was prepared using Dragon voice recognition software and may include unintentional dictation errors.   Merwyn Katos, MD 05/28/22 2222

## 2022-05-28 NOTE — ED Triage Notes (Signed)
Pt presents via POV with complaints of abdominal pain with associated cramping- pt is believed to be [redacted] weeks pregnant occurring to her last menstrual cycle. Denies vaginal bleeding/discharge, CP, fevers, or SOB.

## 2022-05-29 LAB — ABO/RH: ABO/RH(D): O NEG

## 2023-01-02 ENCOUNTER — Encounter: Payer: Self-pay | Admitting: Emergency Medicine

## 2023-01-02 ENCOUNTER — Ambulatory Visit
Admission: EM | Admit: 2023-01-02 | Discharge: 2023-01-02 | Disposition: A | Discharge status: Home / Self Care | Payer: BC Managed Care – PPO

## 2023-01-02 DIAGNOSIS — T8141XA Infection following a procedure, superficial incisional surgical site, initial encounter: Secondary | ICD-10-CM

## 2023-01-02 MED ORDER — CEPHALEXIN 500 MG PO CAPS: 500.0000 mg | ORAL_CAPSULE | Freq: Two times a day (BID) | ORAL | 0 refills | Status: AC

## 2023-01-02 NOTE — Discharge Instructions (Signed)
Today we will prophylactically provide bacterial coverage for infection to your C-section  On exam site appears to be healing well and there is no redness, tenderness is most likely a result of abdominal surgery, typically takes up to 6 weeks for full healing, fluid that is draining from the small area that is open and serosanguineous fluid which is normal to the body, no pus at the incision site  Take cephalexin every morning and every evening for 5 days to for general comfort   May take Tylenol every 6 hours as needed for discomfort  May use heating pad over the affected area 10 to 15-minute intervals  May follow-up with urgent care as needed for any concerns regarding healing

## 2023-01-02 NOTE — ED Triage Notes (Signed)
Patient states that she has had pain and discomfort at her incision for a week.  Patient states that she is 2 weeks post c-section.  Patient reports noticing redness, swelling and drainage that she noticed today.  Patient denies fevers.

## 2023-01-02 NOTE — ED Provider Notes (Signed)
[facility]    CSN: [id number] Arrival date & time: 01/02/23  1747      History   Chief Complaint Chief Complaint  Patient presents with   Recurrent Skin Infections    HPI Alexa Jacobs is a 32 y.o. female.   Patient presents for evaluation of pain, erythema, warmth to the skin and drainage at C-section incision.  Childbirth 2 weeks ago.  Has not attempted treatment.  Endorses history of reoccurring skin infections.  Denies fevers    Past Medical History:  Diagnosis Date   ADHD (attention deficit hyperactivity disorder)    Anxiety    Depression     Patient Active Problem List   Diagnosis Date Noted   Encounter for counseling 09/20/2019   Migraine without aura and without status migrainosus, not intractable 10/02/2015   ANEMIA, MILD 10/07/2010   ANXIETY DISORDER 10/07/2010   PANIC DISORDER 10/07/2010   CHEST PAIN 10/07/2010   Generalized anxiety disorder 10/07/2010   ADD 07/26/2009    Past Surgical History:  Procedure Laterality Date   BREAST ENHANCEMENT SURGERY     CESAREAN SECTION     WISDOM TOOTH EXTRACTION      OB History     Gravida  1   Para      Term      Preterm      AB      Living         SAB      IAB      Ectopic      Multiple      Live Births               Home Medications    Prior to Admission medications   Medication Sig Start Date End Date Taking? Authorizing Provider  Ferrous Sulfate (IRON PO) Take 1 tablet by mouth daily. 06/26/22  Yes [provider]  ferrous sulfate 325 (65 FE) MG EC tablet Take by mouth. 12/02/22 12/02/23 Yes [provider]  Prenatal Vit-Fe Fumarate-FA (M-NATAL PLUS) 27-1 MG TABS Take 1 tablet by mouth daily. 08/14/22  Yes [provider]  sertraline (ZOLOFT) 100 MG tablet Take 1 tablet by mouth daily. 08/30/21  Yes [provider]  ciprofloxacin (CIPRO) 750 MG tablet Take 750 mg by mouth 2 (two) times daily. 09/19/19   [provider]   Docusate Sodium (DSS) 100 MG CAPS Take 1 capsule by mouth daily.    [provider]  levonorgestrel (MIRENA) 20 MCG/24HR IUD by Intrauterine route.    [provider]  ondansetron (ZOFRAN-ODT) 8 MG disintegrating tablet Take 1 tablet (8 mg total) by mouth every 8 (eight) hours as needed for nausea or vomiting. 05/28/22   Merwyn Katos, MD  sulfamethoxazole-trimethoprim (BACTRIM DS) 800-160 MG tablet Take 1 tablet by mouth 2 (two) times daily. 09/19/19   [provider]  valACYclovir (VALTREX) 1000 MG tablet Take 1 tablet by mouth as needed. 06/02/19   [provider]    Family History Family History  Problem Relation Age of Onset   Depression Mother    Anxiety disorder Mother     Social History Social History   Tobacco Use   Smoking status: Never   Smokeless tobacco: Never  Vaping Use   Vaping Use: Never used  Substance Use Topics   Alcohol use: No   Drug use: No     Allergies   Patient has no known allergies.   Review of Systems Review of Systems  Physical Exam Triage Vital Signs ED Triage Vitals  Enc Vitals Group     BP 01/02/23 1803 115/77     Pulse Rate 01/02/23 1803 84     Resp 01/02/23 1803 14     Temp 01/02/23 1803 98.4 F (36.9 C)     Temp Source 01/02/23 1803 Oral     SpO2 01/02/23 1803 97 %     Weight 01/02/23 1800 175 lb (79.4 kg)     Height 01/02/23 1800 5\' 5"  (1.651 m)     Head Circumference --      Peak Flow --      Pain Score 01/02/23 1800 3     Pain Loc --      Pain Edu? --      Excl. in [place]? --    No data found.  Updated Vital Signs BP 115/77 (BP Location: Left Arm)   Pulse 84   Temp 98.4 F (36.9 C) (Oral)   Resp 14   Ht 5\' 5"  (1.651 m)   Wt 175 lb (79.4 kg)   LMP 12/31/2021 (Exact Date)   SpO2 97%   Breastfeeding Yes   BMI 29.12 kg/m   Visual Acuity Right Eye Distance:   Left Eye Distance:   Bilateral Distance:    Right Eye Near:   Left Eye Near:    Bilateral Near:     Physical  Exam Constitutional:      Appearance: Normal appearance.  Eyes:     Extraocular Movements: Extraocular movements intact.  Pulmonary:     Effort: Pulmonary effort is normal.  Abdominal:       Comments: Cesarean incision intact, no erythema or swelling noted, tender to palpation, less than 0.5 cm opening present on the right side of the incision with serosanguineous fluid  Neurological:     Mental Status: She is alert and oriented to person, place, and time. Mental status is at baseline.      UC Treatments / Results  Labs (all labs ordered are listed, but only abnormal results are displayed) Labs Reviewed - No data to display  EKG   Radiology No results found.  Procedures Procedures (including critical care time)  Medications Ordered in UC Medications - No data to display  Initial Impression / Assessment and Plan / UC Course  I have reviewed the triage vital signs and the nursing notes.  Pertinent labs & imaging results that were available during my care of the patient were reviewed by me and considered in my medical decision making (see chart for details).  Superficial incisional infection of surgical site  Prophylactically providing coverage for bacteria due to patient's history of reoccurring infections, tenderness is most likely her result of childbirth discussed timeline for possible healing, cephalexin prescribed and recommended over-the-counter Tylenol and heating pad for comfort as well as rest, may follow-up with his urgent care as needed Final Clinical Impressions(s) / UC Diagnoses   Final diagnoses:  None   Discharge Instructions   None    ED Prescriptions   None    PDMP not reviewed this encounter.   Valinda Hoar, [place] 01/02/23 1914

## 2023-03-11 ENCOUNTER — Other Ambulatory Visit: Payer: Self-pay

## 2023-03-11 DIAGNOSIS — R7989 Other specified abnormal findings of blood chemistry: Secondary | ICD-10-CM

## 2023-03-20 ENCOUNTER — Ambulatory Visit
Admission: RE | Admit: 2023-03-20 | Discharge: 2023-03-20 | Disposition: A | Discharge status: Home / Self Care | Payer: BC Managed Care – PPO | Source: Ambulatory Visit | Attending: Physician Assistant | Admitting: Physician Assistant

## 2023-03-20 DIAGNOSIS — R7989 Other specified abnormal findings of blood chemistry: Secondary | ICD-10-CM | POA: Insufficient documentation
